# Patient Record
Sex: Male | Born: 2000 | Race: White | Hispanic: No | Marital: Single
Health system: Southern US, Community
[De-identification: ages and names within clinical notes are randomized; demographics above are authoritative.]

---

## 2019-05-08 ENCOUNTER — Emergency Department (HOSPITAL_COMMUNITY)

## 2019-05-08 ENCOUNTER — Emergency Department (HOSPITAL_COMMUNITY)
Admission: EM | Admit: 2019-05-08 | Discharge: 2019-05-08 | Disposition: A | Discharge status: Home / Self Care | Attending: Emergency Medicine | Admitting: Emergency Medicine

## 2019-05-08 ENCOUNTER — Other Ambulatory Visit: Payer: Self-pay

## 2019-05-08 DIAGNOSIS — Z79899 Other long term (current) drug therapy: Secondary | ICD-10-CM | POA: Insufficient documentation

## 2019-05-08 DIAGNOSIS — Y9319 Activity, other involving water and watercraft: Secondary | ICD-10-CM | POA: Insufficient documentation

## 2019-05-08 DIAGNOSIS — S0990XA Unspecified injury of head, initial encounter: Secondary | ICD-10-CM

## 2019-05-08 DIAGNOSIS — Y999 Unspecified external cause status: Secondary | ICD-10-CM | POA: Insufficient documentation

## 2019-05-08 DIAGNOSIS — M25562 Pain in left knee: Secondary | ICD-10-CM | POA: Insufficient documentation

## 2019-05-08 DIAGNOSIS — Z23 Encounter for immunization: Secondary | ICD-10-CM | POA: Diagnosis not present

## 2019-05-08 DIAGNOSIS — S0081XA Abrasion of other part of head, initial encounter: Secondary | ICD-10-CM | POA: Diagnosis not present

## 2019-05-08 DIAGNOSIS — Y92410 Unspecified street and highway as the place of occurrence of the external cause: Secondary | ICD-10-CM | POA: Insufficient documentation

## 2019-05-08 DIAGNOSIS — T148XXA Other injury of unspecified body region, initial encounter: Secondary | ICD-10-CM

## 2019-05-08 DIAGNOSIS — M79642 Pain in left hand: Secondary | ICD-10-CM | POA: Insufficient documentation

## 2019-05-08 LAB — RAPID URINE DRUG SCREEN, HOSP PERFORMED
Amphetamines: NOT DETECTED
Barbiturates: NOT DETECTED
Benzodiazepines: NOT DETECTED
Cocaine: NOT DETECTED
Opiates: POSITIVE — AB
Tetrahydrocannabinol: NOT DETECTED

## 2019-05-08 MED ORDER — FENTANYL CITRATE (PF) 100 MCG/2ML IJ SOLN
50.0000 ug | Freq: Once | INTRAMUSCULAR | Status: AC
  Administered 2019-05-08: 50 ug via INTRAVENOUS
  Filled 2019-05-08: qty 2

## 2019-05-08 MED ORDER — SODIUM CHLORIDE 0.9 % IV BOLUS
1000.0000 mL | Freq: Once | INTRAVENOUS | Status: AC
  Administered 2019-05-08: 18:00:00 1000 mL via INTRAVENOUS

## 2019-05-08 MED ORDER — BACITRACIN ZINC 500 UNIT/GM EX OINT
TOPICAL_OINTMENT | Freq: Once | CUTANEOUS | Status: AC
  Administered 2019-05-08: 1 via TOPICAL
  Filled 2019-05-08: qty 0.9

## 2019-05-08 MED ORDER — MORPHINE SULFATE (PF) 4 MG/ML IV SOLN
4.0000 mg | Freq: Once | INTRAVENOUS | Status: AC
  Administered 2019-05-08: 17:00:00 4 mg via INTRAVENOUS
  Filled 2019-05-08: qty 1

## 2019-05-08 MED ORDER — ONDANSETRON HCL 4 MG/2ML IJ SOLN
4.0000 mg | Freq: Once | INTRAMUSCULAR | Status: AC
  Administered 2019-05-08: 17:00:00 4 mg via INTRAVENOUS
  Filled 2019-05-08: qty 2

## 2019-05-08 MED ORDER — ONDANSETRON HCL 4 MG/2ML IJ SOLN
4.0000 mg | Freq: Once | INTRAMUSCULAR | Status: AC
  Administered 2019-05-08: 19:00:00 4 mg via INTRAVENOUS
  Filled 2019-05-08: qty 2

## 2019-05-08 MED ORDER — TETANUS-DIPHTH-ACELL PERTUSSIS 5-2.5-18.5 LF-MCG/0.5 IM SUSP
0.5000 mL | Freq: Once | INTRAMUSCULAR | Status: AC
  Administered 2019-05-08: 17:00:00 0.5 mL via INTRAMUSCULAR
  Filled 2019-05-08: qty 0.5
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
## 2019-05-08 NOTE — ED Notes (Signed)
Father at bedside.

## 2019-05-08 NOTE — ED Notes (Signed)
Pt remains awake and alert. Repetitive questions noted. No n/v. Warm blankets applied. IV access obt. Med a/o.
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
## 2019-05-10 ENCOUNTER — Telehealth: Payer: Self-pay | Admitting: Physical Therapy
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
